# Patient Record
Sex: Female | Born: 1975 | Race: White | Hispanic: No | Marital: Married | Smoking: Current every day smoker
Health system: Southern US, Community
[De-identification: ages and names within clinical notes are randomized; demographics above are authoritative.]

## PROBLEM LIST (undated history)

## (undated) DIAGNOSIS — J439 Emphysema, unspecified: Secondary | ICD-10-CM

## (undated) DIAGNOSIS — M810 Age-related osteoporosis without current pathological fracture: Secondary | ICD-10-CM

## (undated) HISTORY — PX: DILATION AND CURETTAGE OF UTERUS: SHX78

---

## 2006-09-04 ENCOUNTER — Emergency Department: Payer: Self-pay | Admitting: Emergency Medicine

## 2007-05-25 ENCOUNTER — Emergency Department: Payer: Self-pay | Admitting: Emergency Medicine

## 2007-07-25 ENCOUNTER — Ambulatory Visit: Payer: Self-pay | Admitting: Unknown Physician Specialty

## 2007-09-26 ENCOUNTER — Ambulatory Visit: Payer: Self-pay | Admitting: General Surgery

## 2010-01-08 ENCOUNTER — Ambulatory Visit: Payer: Self-pay | Admitting: Family Medicine

## 2010-06-04 ENCOUNTER — Emergency Department: Payer: Self-pay | Admitting: Emergency Medicine

## 2010-11-14 ENCOUNTER — Emergency Department: Payer: Self-pay | Admitting: Unknown Physician Specialty

## 2010-11-15 ENCOUNTER — Emergency Department: Payer: Self-pay | Admitting: Emergency Medicine

## 2012-04-12 ENCOUNTER — Emergency Department: Payer: Self-pay | Admitting: Emergency Medicine

## 2013-11-19 ENCOUNTER — Emergency Department: Payer: Self-pay | Admitting: Emergency Medicine

## 2013-12-07 ENCOUNTER — Ambulatory Visit: Payer: Self-pay | Admitting: Family Medicine

## 2013-12-20 ENCOUNTER — Ambulatory Visit: Payer: Self-pay | Admitting: Family Medicine

## 2013-12-20 IMAGING — US THYROID ULTRASOUND
1 series · 14 of 25 positions shown · non-contrast
Comparison: [DATE]

CLINICAL DATA: Thyroid nodule. Previous FNA biopsy dominant right
lesion [DATE].

EXAM:
THYROID ULTRASOUND
TECHNIQUE: Ultrasound examination of the thyroid gland and adjacent soft
tissues was performed.

[Series 1: thyroid ultrasound · 0.08mm/px · 14 of 73 slices shown]
[im 1/73]
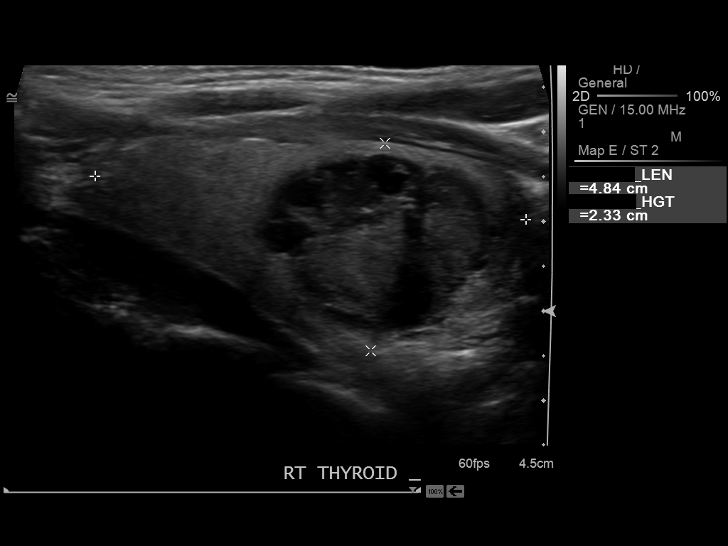
[im 7/73]
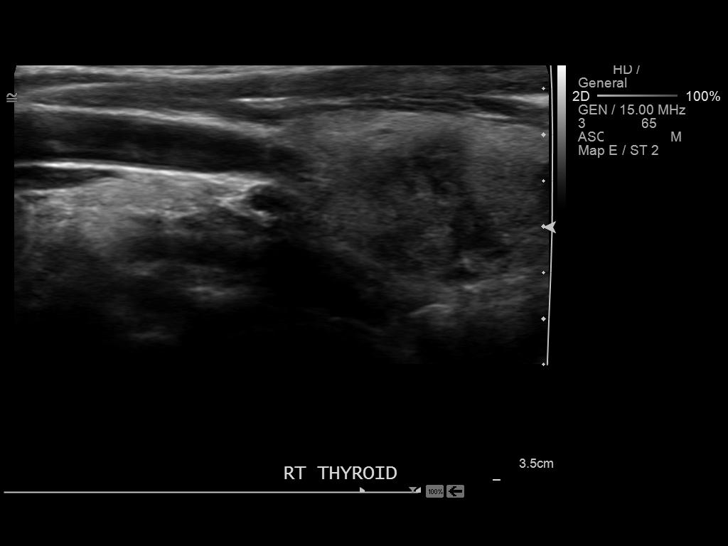
[im 13/73]
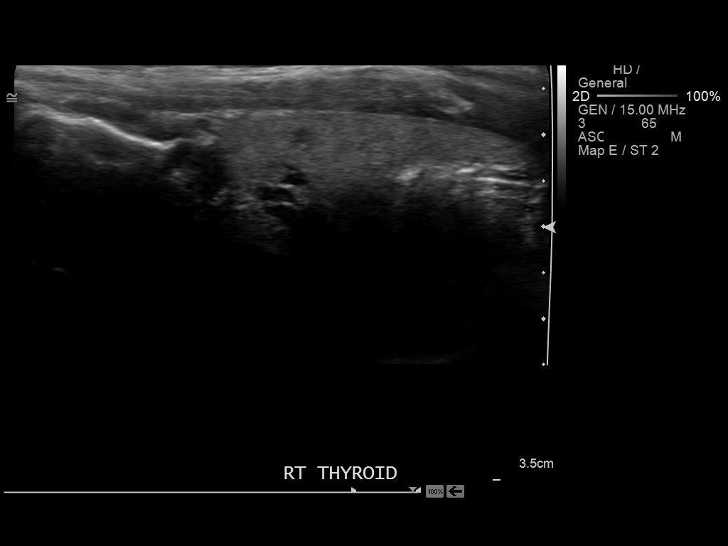
[im 19/73]
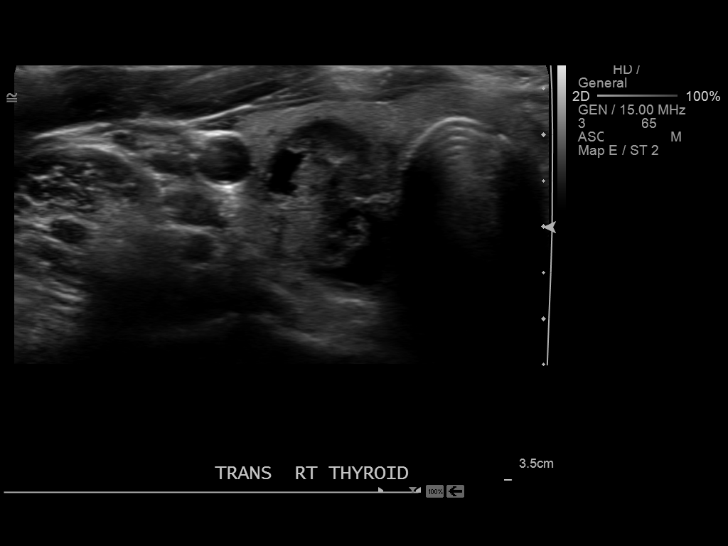
[im 25/73]
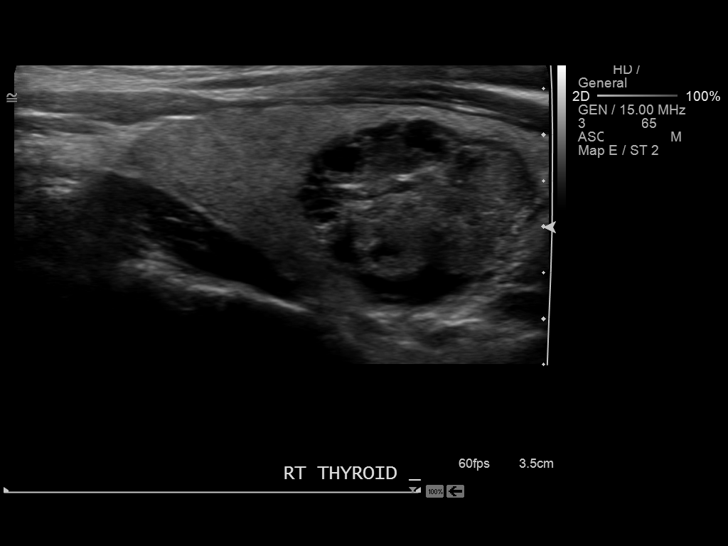
[im 28/73]
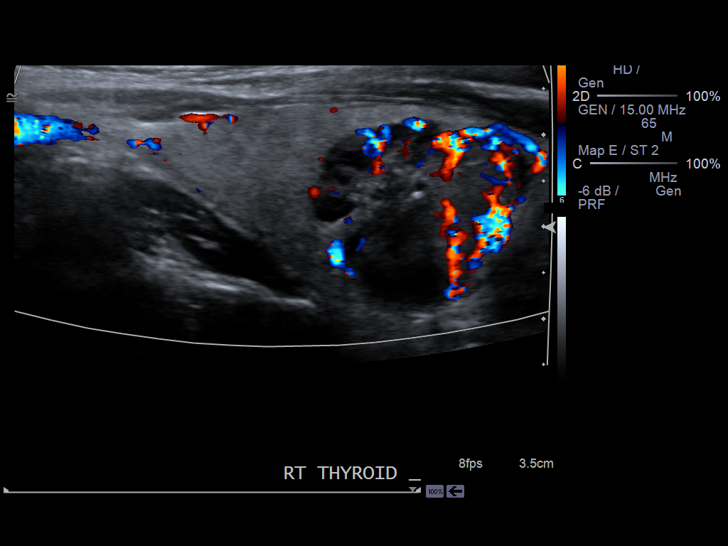
[im 34/73]
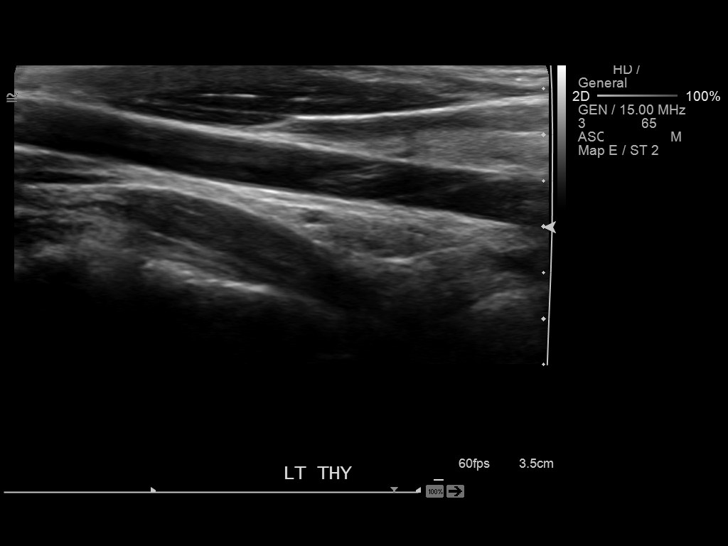
[im 40/73]
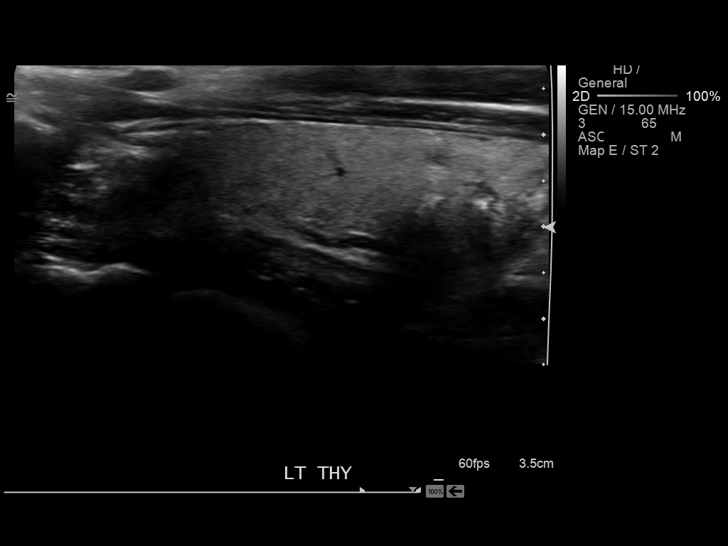
[im 46/73]
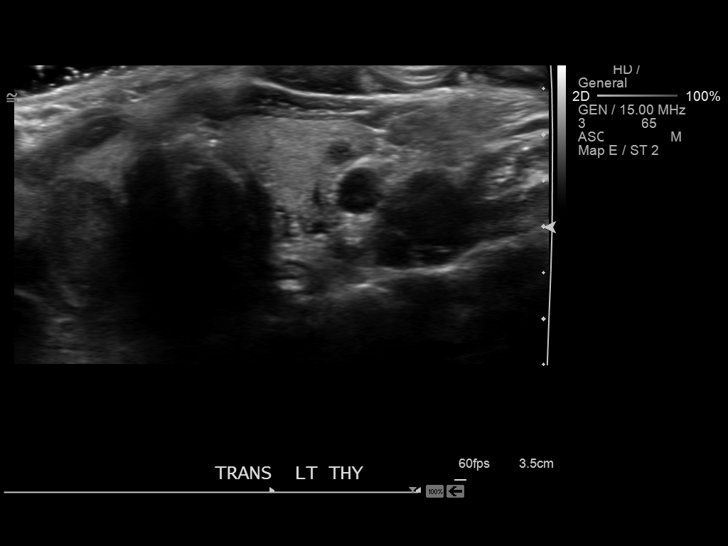
[im 49/73]
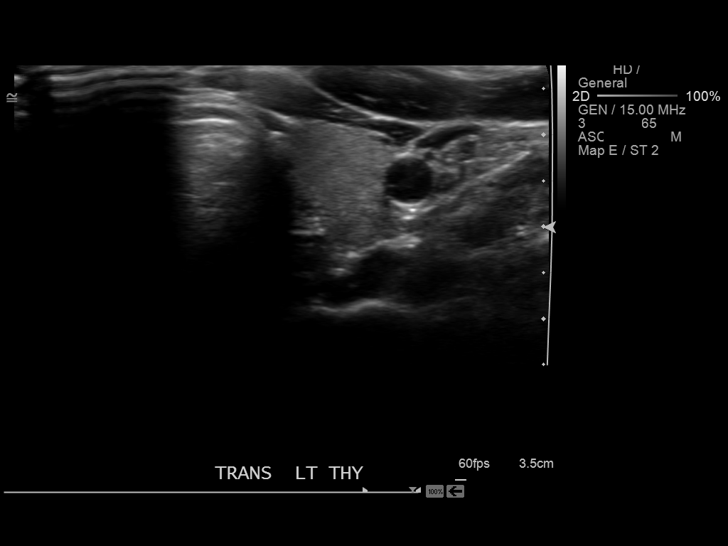
[im 55/73]
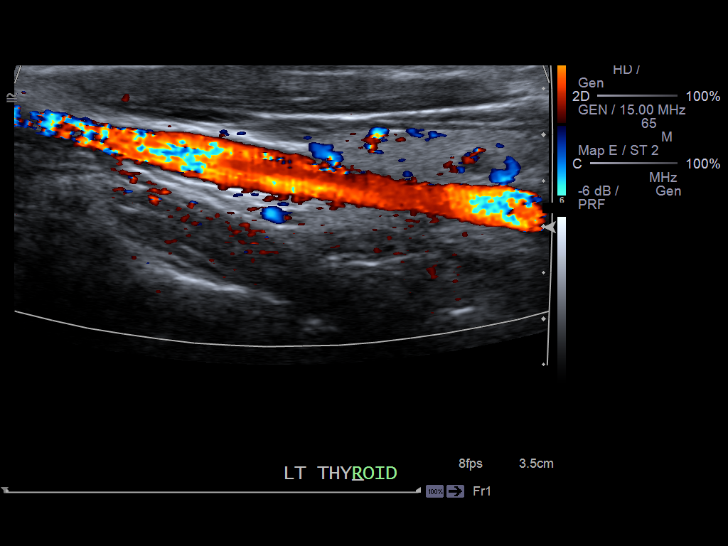
[im 61/73]
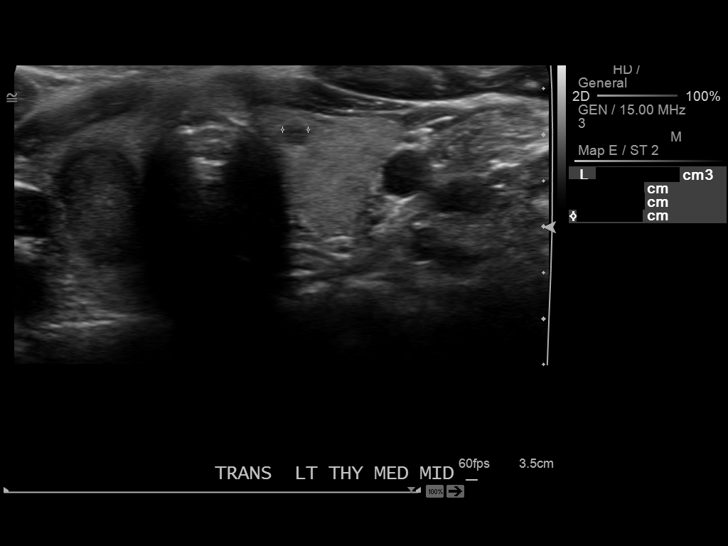
[im 67/73]
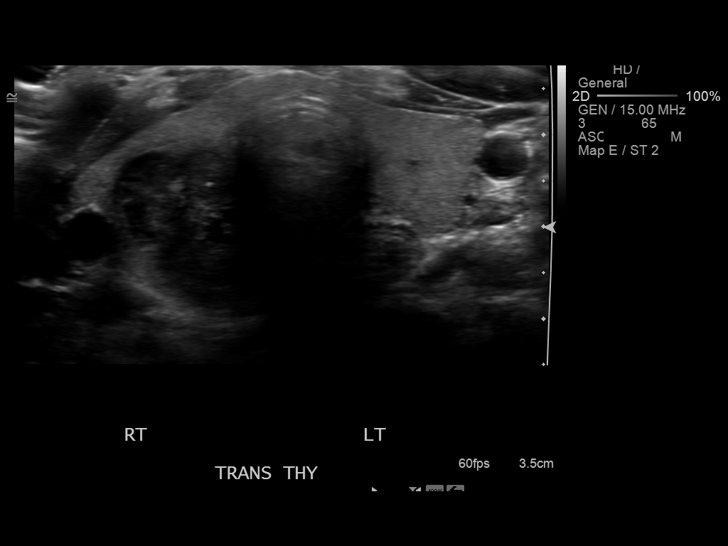
[im 73/73]
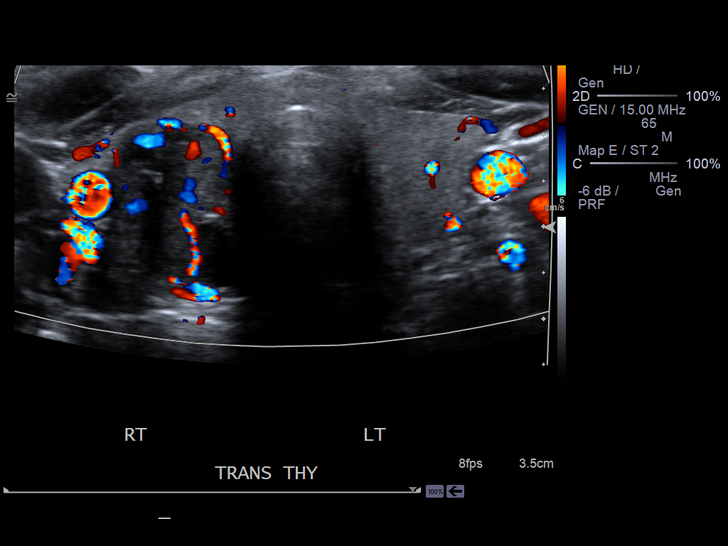

[14 of 25 positions shown; findings below may reference images not displayed]

FINDINGS: Right thyroid lobe

Measurements: 48 x 23 x 18 mm. There is a complex mostly solid

Left thyroid lobe

Measurements: 42 x 17 x 13 mm. At least 3 hypoechoic/ cystic nodules
all less than 5 mm.

Isthmus

Thickness: 3 mm.  No nodules visualized.

Lymphadenopathy

None visualized.
IMPRESSION: 1. No definite enlargement of dominant right thyroid lesion.
Correlate with previous biopsy results.

## 2015-03-17 ENCOUNTER — Encounter: Payer: Self-pay | Admitting: Emergency Medicine

## 2015-03-17 ENCOUNTER — Ambulatory Visit
Admission: EM | Admit: 2015-03-17 | Discharge: 2015-03-17 | Disposition: A | Discharge status: Home / Self Care | Payer: Medicaid Other | Attending: Family Medicine | Admitting: Family Medicine

## 2015-03-17 DIAGNOSIS — M67432 Ganglion, left wrist: Secondary | ICD-10-CM

## 2015-03-17 HISTORY — DX: Emphysema, unspecified: J43.9

## 2015-03-17 HISTORY — DX: Age-related osteoporosis without current pathological fracture: M81.0

## 2015-03-17 NOTE — Discharge Instructions (Signed)
° ° °  Ibuprofen 200 mg - 2 tablets  3 x day with meals as needed Athletic rubs  Ice with towel protection  Schedule evaluation with Ortho if persistent or increased discomfort  Ganglion Cyst A ganglion cyst is a noncancerous, fluid-filled lump that occurs near joints or tendons. The ganglion cyst grows out of a joint or the lining of a tendon. It most often develops in the hand or wrist but can also develop in the shoulder, elbow, hip, knee, ankle, or foot. The round or oval ganglion can be pea sized or larger than a grape. Increased activity may enlarge the size of the cyst because more fluid starts to build up.  CAUSES  It is not completely known what causes a ganglion cyst to grow. However, it may be related to:  Inflammation or irritation around the joint.  An injury.  Repetitive movements or overuse.  Arthritis. SYMPTOMS  A lump most often appears in the hand or wrist, but can occur in other areas of the body. Generally, the lump is painless without other symptoms. However, sometimes pain can be felt during activity or when pressure is applied to the lump. The lump may even be tender to the touch. Tingling, pain, numbness, or muscle weakness can occur if the ganglion cyst presses on a nerve. Your grip may be weak and you may have less movement in your joints.  DIAGNOSIS  Ganglion cysts are most often diagnosed based on a physical exam, noting where the cyst is and how it looks. Your caregiver will feel the lump and may shine a light alongside it. If it is a ganglion, a light often shines through it. Your caregiver may order an X-ray, ultrasound, or MRI to rule out other conditions. TREATMENT  Ganglions usually go away on their own without treatment. If pain or other symptoms are involved, treatment may be needed. Treatment is also needed if the ganglion limits your movement or if it gets infected. Treatment options include:  Wearing a wrist or finger brace or splint.  Taking  anti-inflammatory medicine.  Draining fluid from the lump with a needle (aspiration).  Injecting a steroid into the joint.  Surgery to remove the ganglion cyst and its stalk that is attached to the joint or tendon. However, ganglion cysts can grow back. HOME CARE INSTRUCTIONS   Do not press on the ganglion, poke it with a needle, or hit it with a heavy object. You may rub the lump gently and often. Sometimes fluid moves out of the cyst.  Only take medicines as directed by your caregiver.  Wear your brace or splint as directed by your caregiver. SEEK MEDICAL CARE IF:   Your ganglion becomes larger or more painful.  You have increased redness, red streaks, or swelling.  You have pus coming from the lump.  You have weakness or numbness in the affected area. MAKE SURE YOU:   Understand these instructions.  Will watch your condition.  Will get help right away if you are not doing well or get worse. Document Released: 08/13/2000 Document Revised: 05/10/2012 Document Reviewed: 10/10/2007 [facility] Patient Information 2015 [place] [place]. This information is not intended to replace advice given to you by your health care provider. Make sure you discuss any questions you have with your health care provider.

## 2015-03-17 NOTE — ED Notes (Signed)
Pt reports "2 knots" on her inside left wrist, redness, swelling, sometimes itch. Pt has scar on L wrist where she had been cut by glass in May. [facility]. Knots on L wrist started about a month ago. No drainage.

## 2015-03-17 NOTE — ED Provider Notes (Signed)
CSN: [id number]     Arrival date & time 03/17/15  1344 History   First MD Initiated Contact with Patient 03/17/15 1512     Chief Complaint  Patient presents with  . Wrist Pain   (Consider location/radiation/quality/duration/timing/severity/associated sxs/prior Treatment) HPI  Patient  presents on work -up as Vanessa Sheppard, states she is one in the same. 39 yo  F with left wrist ganglion times 2 present about 2 months . In May 2016 she had a laceration injury to the medial left wrist which was sutured. She wore a wrist splint for a number of weeks for comfort the D/C. In the past month or so she has become aware of two small tender areas in medial left wrist that are now palpable . Mildly tender particularly if she pushes on them. Thought about sticking with a needled but deferred.  Past Medical History  Diagnosis Date  . Osteoporosis   . Mild emphysema    Past Surgical History  Procedure Laterality Date  . Dilation and curettage of uterus     History reviewed. No pertinent family history. History  Substance Use Topics  . Smoking status: Current Every Day Smoker -- 1.00 packs/day    Types: Cigarettes  . Smokeless tobacco: Not on file  . Alcohol Use: Yes   OB History    No data available     Review of Systems Review of 10 systems negative for acute change except as referenced in HPI  Allergies  Codeine; Keflex; Penicillins; Percocet; Actonel; and Pork-derived products  Home Medications   Prior to Admission medications   Medication Sig Start Date End Date Taking? Authorizing Provider  Calcium Carbonate-Vitamin D (CALTRATE 600+D) 600-400 MG-UNIT per tablet Take 1 tablet by mouth daily.   Yes Historical Provider, MD   BP 91/59 mmHg  Pulse 85  Temp(Src) 98.1 F (36.7 C) (Oral)  Resp 16  Ht 5\' 2"  (1.575 m)  Wt 90 lb (40.824 kg)  BMI 16.46 kg/m2  SpO2 100%  LMP 02/25/2015 (Approximate) Physical Exam   Constitutional -alert and oriented,well appearing and in no acute  distress Head-atraumatic, normocephalic Eyes- conjunctiva normal, EOMI ,conjugate gaze Nose- no congestion or rhinorrhea Mouth/throat- mucous membranes moist , Neck- supple CV- regular rate, grossly normal heart sounds,  Resp-no distress, normal respiratory effort,clear to auscultation bilaterally GI-,no distention GU-  not examined MSK-, normal ROM, all extremities, ambulatory, self-care; medial left wrist at the carpal tunnel and 4 FB below the area are 2 individual structures, 1 cm in size, palpably cystic consistency-no evidence of infection, mildly inflamed  Neuro- normal speech and language,  Skin-warm,dry ,intact; no rash noted Psych-mood and affect grossly normal; speech and behavior grossly normal  ED Course  Procedures (including critical care time) Labs Review Labs Reviewed - No data to display  Imaging Review No results found.   MDM   1. Ganglion of wrist, left    Diagnosis and treatment discussed. Anti-inflammatories, cool packs if desired. Try not to touch and manipulate because it inflames them. Usually resolve in time but if large or tender or annoying can be addressed by Orthopedics for possible drainage/ injection .Recommend giving time to spontaneously resolve at this time. Informational handouts given. Questions fielded, expectations and recommendations reviewed. Patient expresses understanding. [facility] questions, concern or exacerbation.     Rae HalstedLaurie W Slayden Mennenga, PA-C 03/19/15 1341

## 2015-03-19 ENCOUNTER — Encounter: Payer: Self-pay | Admitting: Physician Assistant

## 2015-03-27 ENCOUNTER — Ambulatory Visit (INDEPENDENT_AMBULATORY_CARE_PROVIDER_SITE_OTHER): Payer: Medicaid Other | Admitting: Family Medicine

## 2015-03-27 ENCOUNTER — Encounter: Payer: Self-pay | Admitting: Family Medicine

## 2015-03-27 VITALS — BP 105/69 | HR 100 | Temp 97.8°F | Wt 88.8 lb

## 2015-03-27 DIAGNOSIS — N3000 Acute cystitis without hematuria: Secondary | ICD-10-CM | POA: Diagnosis not present

## 2015-03-27 DIAGNOSIS — H8112 Benign paroxysmal vertigo, left ear: Secondary | ICD-10-CM | POA: Diagnosis not present

## 2015-03-27 DIAGNOSIS — H8110 Benign paroxysmal vertigo, unspecified ear: Secondary | ICD-10-CM | POA: Insufficient documentation

## 2015-03-27 DIAGNOSIS — Z720 Tobacco use: Secondary | ICD-10-CM | POA: Insufficient documentation

## 2015-03-27 NOTE — Assessment & Plan Note (Signed)
Encouraged cessation; tips for success given in the after visit summary 

## 2015-03-27 NOTE — Patient Instructions (Addendum)
Please call ENT as soon as possible per the ER instructions so they can work on these crystals and get you feeling better Doreatha Martin out your antibiotics Please do eat yogurt daily or take a probiotic daily for the next month or two We want to replace the healthy germs in the gut If you notice foul, watery diarrhea in the next two months, schedule an appointment RIGHT AWAY Do get three servings of calcium a day Practice fall precautions Try to quit smoking  Smoking Cessation, Tips for Success If you are ready to quit smoking, congratulations! You have chosen to help yourself be healthier. Cigarettes bring nicotine, tar, carbon monoxide, and other irritants into your body. Your lungs, heart, and blood vessels will be able to work better without these poisons. There are many different ways to quit smoking. Nicotine gum, nicotine patches, a nicotine inhaler, or nicotine nasal spray can help with physical craving. Hypnosis, support groups, and medicines help break the habit of smoking. WHAT THINGS CAN I DO TO MAKE QUITTING EASIER?  Here are some tips to help you quit for good:  Pick a date when you will quit smoking completely. Tell all of your friends and family about your plan to quit on that date.  Do not try to slowly cut down on the number of cigarettes you are smoking. Pick a quit date and quit smoking completely starting on that day.  Throw away all cigarettes.   Clean and remove all ashtrays from your home, work, and car.  On a card, write down your reasons for quitting. Carry the card with you and read it when you get the urge to smoke.  Cleanse your body of nicotine. Drink enough water and fluids to keep your urine clear or pale yellow. Do this after quitting to flush the nicotine from your body.  Learn to predict your moods. Do not let a bad situation be your excuse to have a cigarette. Some situations in your life might tempt you into wanting a cigarette.  Never have "just one"  cigarette. It leads to wanting another and another. Remind yourself of your decision to quit.  Change habits associated with smoking. If you smoked while driving or when feeling stressed, try other activities to replace smoking. Stand up when drinking your coffee. Brush your teeth after eating. Sit in a different chair when you read the paper. Avoid alcohol while trying to quit, and try to drink fewer caffeinated beverages. Alcohol and caffeine may urge you to smoke.  Avoid foods and drinks that can trigger a desire to smoke, such as sugary or spicy foods and alcohol.  Ask people who smoke not to smoke around you.  Have something planned to do right after eating or having a cup of coffee. For example, plan to take a walk or exercise.  Try a relaxation exercise to calm you down and decrease your stress. Remember, you may be tense and nervous for the first 2 weeks after you quit, but this will pass.  Find new activities to keep your hands busy. Play with a pen, coin, or rubber band. Doodle or draw things on paper.  Brush your teeth right after eating. This will help cut down on the craving for the taste of tobacco after meals. You can also try mouthwash.   Use oral substitutes in place of cigarettes. Try using lemon drops, carrots, cinnamon sticks, or chewing gum. Keep them handy so they are available when you have the urge to smoke.  When  you have the urge to smoke, try deep breathing.  Designate your home as a nonsmoking area.  If you are a heavy smoker, ask your health care provider about a prescription for nicotine chewing gum. It can ease your withdrawal from nicotine.  Reward yourself. Set aside the cigarette money you save and buy yourself something nice.  Look for support from others. Join a support group or smoking cessation program. Ask someone at home or at work to help you with your plan to quit smoking.  Always ask yourself, "Do I need this cigarette or is this just a reflex?"  Tell yourself, "Today, I choose not to smoke," or "I do not want to smoke." You are reminding yourself of your decision to quit.  Do not replace cigarette smoking with electronic cigarettes (commonly called e-cigarettes). The safety of e-cigarettes is unknown, and some may contain harmful chemicals.  If you relapse, do not give up! Plan ahead and think about what you will do the next time you get the urge to smoke. HOW WILL I FEEL WHEN I QUIT SMOKING? You may have symptoms of withdrawal because your body is used to nicotine (the addictive substance in cigarettes). You may crave cigarettes, be irritable, feel very hungry, cough often, get headaches, or have difficulty concentrating. The withdrawal symptoms are only temporary. They are strongest when you first quit but will go away within 10-14 days. When withdrawal symptoms occur, stay in control. Think about your reasons for quitting. Remind yourself that these are signs that your body is healing and getting used to being without cigarettes. Remember that withdrawal symptoms are easier to treat than the major diseases that smoking can cause.  Even after the withdrawal is over, expect periodic urges to smoke. However, these cravings are generally short lived and will go away whether you smoke or not. Do not smoke! WHAT RESOURCES ARE AVAILABLE TO HELP ME QUIT SMOKING? Your health care provider can direct you to community resources or hospitals for support, which may include:  Group support.  Education.  Hypnosis.  Therapy. Document Released: 05/14/2004 Document Revised: 12/31/2013 Document Reviewed: 02/01/2013 [facility] Patient Information 2015 [place], [place]. This information is not intended to replace advice given to you by your health care provider. Make sure you discuss any questions you have with your health care provider.

## 2015-03-27 NOTE — Assessment & Plan Note (Signed)
Encouraged patient to call [facility] ENT right away per the ER instructions; I will put in an official referral in case needed by her insurance, but patient will call for the appointment; she has plenty of meclizine to last until seen

## 2015-03-27 NOTE — Progress Notes (Signed)
BP 105/69 mmHg  Pulse 100  Temp(Src) 97.8 F (36.6 C)  Wt 88 lb 12.8 oz (40.279 kg)  SpO2 100%  LMP 02/25/2015 (Approximate)   Subjective:    Patient ID: Vanessa Sheppard, female    DOB: 1976-05-05, 39 y.o.   MRN: [id number]  HPI: Vanessa Sheppard is a 39 y.o. female  Chief Complaint  Patient presents with  . Dizziness    since Saturday, went to [facility] ER on Sunday   She woke up on Saturday; she feels horrible She went to the ER and they turned her and held her head; it was supposed to make it go away; she has crystals in her ear They told her to follow-up with her regular doctor The meclizine helps but knocks her out The ER papers want her to schedule appt with ENT ASAP and that is with [facility]NT in [facility] They did labs, EKG, urine pregnancy; they did the exercises and tilted her on the table there; turned to the left was the worst  She also had a urinary tract infection and they gave her antibiotics; no nausea or rash; no symptoms at all for the UTI  She has cut down on her smoking; down to 1/2 ppd  Relevant past medical, surgical, family and social history reviewed and updated as indicated. Interim medical history since our last visit reviewed. Allergies and medications reviewed and updated.  Review of Systems  Constitutional: Negative for unexpected weight change.  Genitourinary: Negative for dysuria and hematuria.  Neurological: Positive for dizziness.    Per HPI unless specifically indicated above     Objective:    BP 105/69 mmHg  Pulse 100  Temp(Src) 97.8 F (36.6 C)  Wt 88 lb 12.8 oz (40.279 kg)  SpO2 100%  LMP 02/25/2015 (Approximate)  Wt Readings from Last 3 Encounters:  03/27/15 88 lb 12.8 oz (40.279 kg)  03/17/15 90 lb (40.824 kg)    Physical Exam  Constitutional: She appears well-developed. No distress.  underweight  HENT:  Head: Normocephalic and atraumatic.  Right Ear: Hearing, tympanic membrane, external ear and ear canal normal. No middle ear  effusion.  Left Ear: Hearing, tympanic membrane, external ear and ear canal normal.  No middle ear effusion.  Nose: Septal deviation present. No rhinorrhea.  Mouth/Throat: Mucous membranes are normal. No oral lesions. No posterior oropharyngeal edema or posterior oropharyngeal erythema.  Eyes: EOM are normal. No scleral icterus.  Neck: No thyromegaly present.  Cardiovascular: Normal rate.   Pulmonary/Chest: Effort normal.  Abdominal: She exhibits no distension.  Neurological: She is alert. She displays no tremor. She exhibits normal muscle tone. Gait normal.  EOMI  Skin: No pallor.  Psychiatric: She has a normal mood and affect. Her behavior is normal. Judgment and thought content normal.    No results found for this or any previous visit.    Assessment & Plan:   Problem List Items Addressed This Visit      Nervous and Auditory   BPPV (benign paroxysmal positional vertigo) - Primary    Encouraged patient to call [facility]NT right away per the ER instructions; I will put in an official referral in case needed by her insurance, but patient will call for the appointment; she has plenty of meclizine to last until seen        Genitourinary   Acute cystitis    Finish out antibiotics        Other   Tobacco abuse    Encouraged cessation;  tips for success given in the after visit summary         Follow up plan: No Follow-up on file.  An After Visit Summary was printed and given to the patient.

## 2015-03-27 NOTE — Assessment & Plan Note (Signed)
Finish out antibiotics.
